# Patient Record
Sex: Male | Born: 1995 | Race: White | Hispanic: No | Marital: Single | Smoking: Former smoker
Health system: Southern US, Community
[De-identification: ages and names within clinical notes are randomized; demographics above are authoritative.]

---
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
## 2014-07-16 ENCOUNTER — Telehealth: Payer: Self-pay | Admitting: Emergency Medicine

## 2014-07-16 ENCOUNTER — Emergency Department (INDEPENDENT_AMBULATORY_CARE_PROVIDER_SITE_OTHER): Payer: 59

## 2014-07-16 ENCOUNTER — Emergency Department
Admission: EM | Admit: 2014-07-16 | Discharge: 2014-07-16 | Disposition: A | Discharge status: Home / Self Care | Payer: 59 | Source: Home / Self Care | Attending: Family Medicine | Admitting: Family Medicine

## 2014-07-16 ENCOUNTER — Encounter: Payer: Self-pay | Admitting: Emergency Medicine

## 2014-07-16 DIAGNOSIS — J029 Acute pharyngitis, unspecified: Secondary | ICD-10-CM

## 2014-07-16 DIAGNOSIS — R05 Cough: Secondary | ICD-10-CM

## 2014-07-16 DIAGNOSIS — R059 Cough, unspecified: Secondary | ICD-10-CM

## 2014-07-16 DIAGNOSIS — J069 Acute upper respiratory infection, unspecified: Secondary | ICD-10-CM

## 2014-07-16 LAB — POCT CBC W AUTO DIFF (K'VILLE URGENT CARE)

## 2014-07-16 LAB — POCT RAPID STREP A (OFFICE): RAPID STREP A SCREEN: NEGATIVE

## 2014-07-16 MED ORDER — BENZONATATE 200 MG PO CAPS: 200.0000 mg | ORAL_CAPSULE | Freq: Every day | ORAL | Status: DC

## 2014-07-16 MED ORDER — AZITHROMYCIN 250 MG PO TABS: ORAL_TABLET | ORAL | Status: DC

## 2014-07-16 NOTE — ED Provider Notes (Signed)
CSN: [id number]     Arrival date & time 07/16/14  1111 History   First MD Initiated Contact with Patient 07/16/14 1138     Chief Complaint  Patient presents with  . Sore Throat      HPI Comments: Patient complains of persistent productive cough for one month, now worse for about a week.  He often coughs until he gags.  He has had a sore throat and increased sinus congestion for several days.  He developed chills last night.  The history is provided by the patient.    History reviewed. No pertinent past medical history. History reviewed. No pertinent past surgical history. History reviewed. No pertinent family history. History  Substance Use Topics  . Smoking status: Current Every Day Smoker  . Smokeless tobacco: Not on file  . Alcohol Use: No    Review of Systems + sore throat + cough No pleuritic pain No wheezing + nasal congestion + post-nasal drainage ? sinus pain/pressure No itchy/red eyes ? earache No hemoptysis No SOB No fever, + chills No nausea No vomiting No abdominal pain No diarrhea No urinary symptoms No skin rash + fatigue No myalgias + headache Used OTC meds without relief   Allergies  Review of patient's allergies indicates no known allergies.  Home Medications   Prior to Admission medications   Medication Sig Start Date End Date Taking? Authorizing Provider  azithromycin (ZITHROMAX Z-PAK) 250 MG tablet Take 2 tabs today; then begin one tab once daily for 4 more days. 07/16/14   Lattie Haw, MD  benzonatate (TESSALON) 200 MG capsule Take 1 capsule (200 mg total) by mouth at bedtime. Take as needed for cough 07/16/14   Lattie Haw, MD   BP 107/66  Pulse 85  Temp(Src) 98.4 F (36.9 C) (Oral)  Ht 6\' 2"  (1.88 m)  Wt 150 lb (68.04 kg)  BMI 19.25 kg/m2  SpO2 99% Physical Exam Nursing notes and Vital Signs reviewed. Appearance:  Patient appears healthy, stated age, and in no acute distress Eyes:  Pupils are equal, round, and reactive to  light and accomodation.  Extraocular movement is intact.  Conjunctivae are not inflamed  Ears:  Canals normal.  Tympanic membranes normal.  Nose:  Mildly congested turbinates, worse on the right.  No sinus tenderness.   Pharynx:  Erythematous without exudate or swelling Neck:  Supple.   Tender shotty anterior/posterior nodes are palpated bilaterally  Lungs:  Clear to auscultation.  Breath sounds are equal.  Heart:  Regular rate and rhythm without murmurs, rubs, or gallops.  Abdomen:  Nontender without masses or hepatosplenomegaly.  Bowel sounds are present.  No CVA or flank tenderness.  Extremities:  No edema.  No calf tenderness Skin:  No rash present.   ED Course  Procedures  none    Labs Reviewed  POCT RAPID STREP A (OFFICE) negative POCT CBC:  WBC 8.9; LY 16.5; MO 3.4; GR 80.1; Hgb 13.2; Platelets 182    Imaging Review: DG Chest 2 View (Final result)  Result time: 07/16/14 12:35:18    Final result by Rad Results In Interface (07/16/14 12:35:18)    Narrative:   CLINICAL DATA: Cough  EXAM: CHEST 2 VIEW  COMPARISON: None.  FINDINGS: The heart size and mediastinal contours are within normal limits. Both lungs are clear. The visualized skeletal structures are unremarkable.  IMPRESSION: No active cardiopulmonary disease.   Electronically Signed By: Christiana Pellant M.D. On: 07/16/2014 12:35        MDM  1. Acute upper respiratory infections of unspecified site   2. Acute pharyngitis, unspecified pharyngitis type    Begin Z-pack for atypical coverage.  Prescription written for Benzonatate [facility](Tessalon) to take at bedtime for night-time cough.  Take plain Mucinex (1200 mg guaifenesin) twice daily for cough and congestion.  May add Sudafed for sinus congestion.   Increase fluid intake, rest. May use Afrin nasal spray (or generic oxymetazoline) twice daily for about 5 days.  Also recommend using saline nasal spray several times daily and saline nasal irrigation (AYR is a  common brand) Try warm salt water gargles for sore throat.  Stop all antihistamines for now, and other non-prescription cough/cold preparations. May take Ibuprofen 200mg , 4 tabs every 8 hours with food for headache, sore throat, fever, etc. Followup with Family Doctor if not improved in about 10 days.    Lattie HawStephen A Caster F[place], MD 07/19/14 [phone]

## 2014-07-16 NOTE — Discharge Instructions (Signed)
Take plain Mucinex (1200 mg guaifenesin) twice daily for cough and congestion.  May add Sudafed for sinus congestion.   Increase fluid intake, rest. May use Afrin nasal spray (or generic oxymetazoline) twice daily for about 5 days.  Also recommend using saline nasal spray several times daily and saline nasal irrigation (AYR is a common brand) Try warm salt water gargles for sore throat.  Stop all antihistamines for now, and other non-prescription cough/cold preparations. May take Ibuprofen 200mg , 4 tabs every 8 hours with food for headache, sore throat, fever, etc.   Follow-up with family doctor if not improving 7 to 10 days.    Salt Water Gargle This solution will help make your mouth and throat feel better. HOME CARE INSTRUCTIONS   Mix 1 teaspoon of salt in 8 ounces of warm water.  Gargle with this solution as much or often as you need or as directed. Swish and gargle gently if you have any sores or wounds in your mouth.  Do not swallow this mixture. Document Released: 09/04/2004 Document Revised: 02/23/2012 Document Reviewed: 01/26/2009 [facility] Patient Information 2015 [employer], [place]. This information is not intended to replace advice given to you by your health care provider. Make sure you discuss any questions you have with your health care provider.

## 2014-07-16 NOTE — ED Notes (Signed)
Reports cough and congestion for about a month; yesterday developed into severe sore throat. No known fever but did have chills last night. No OTCs..Marland Kitchen

## 2014-07-17 ENCOUNTER — Telehealth: Payer: Self-pay | Admitting: *Deleted

## 2014-07-17 LAB — STREP A DNA PROBE: GASP: POSITIVE

## 2015-07-24 IMAGING — CR DG CHEST 2V
2 series · 2 of 2 positions shown · non-contrast
Comparison: None.

CLINICAL DATA: Cough

EXAM:
CHEST  2 VIEW

[view not recorded (1 of 2)]
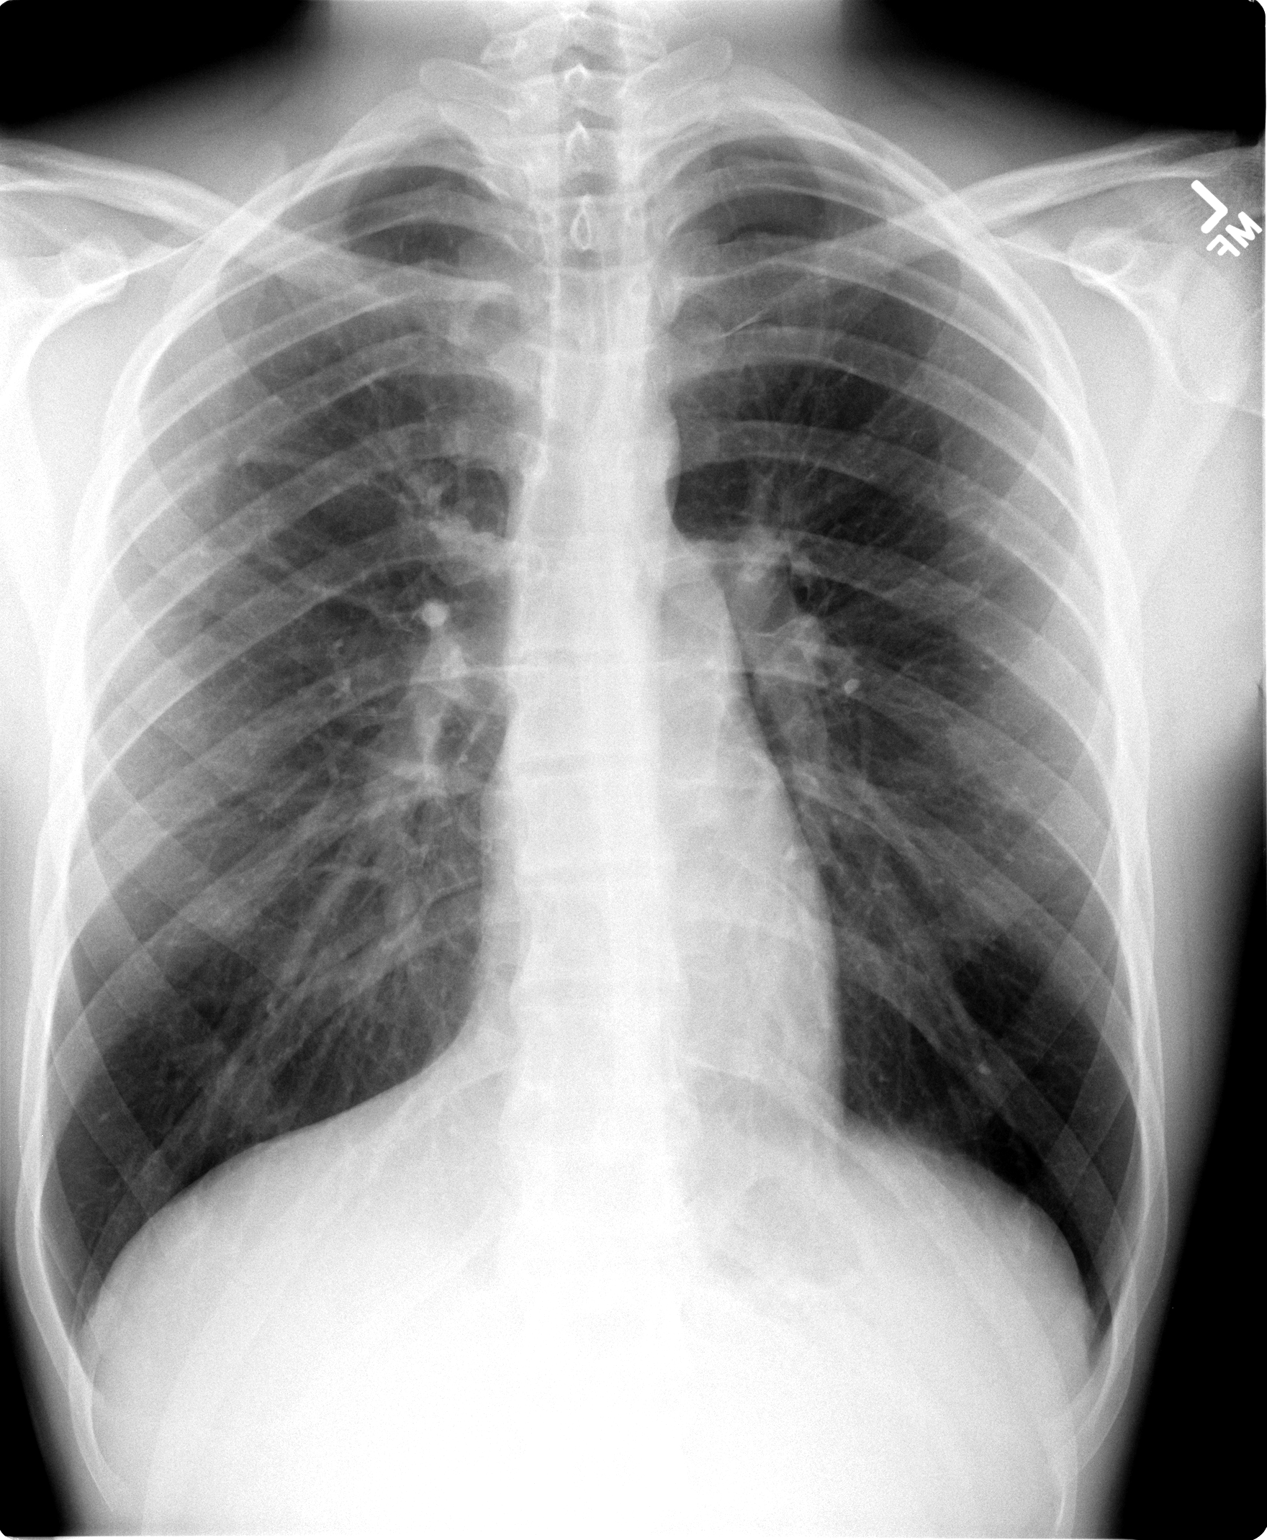

[view not recorded (2 of 2)]
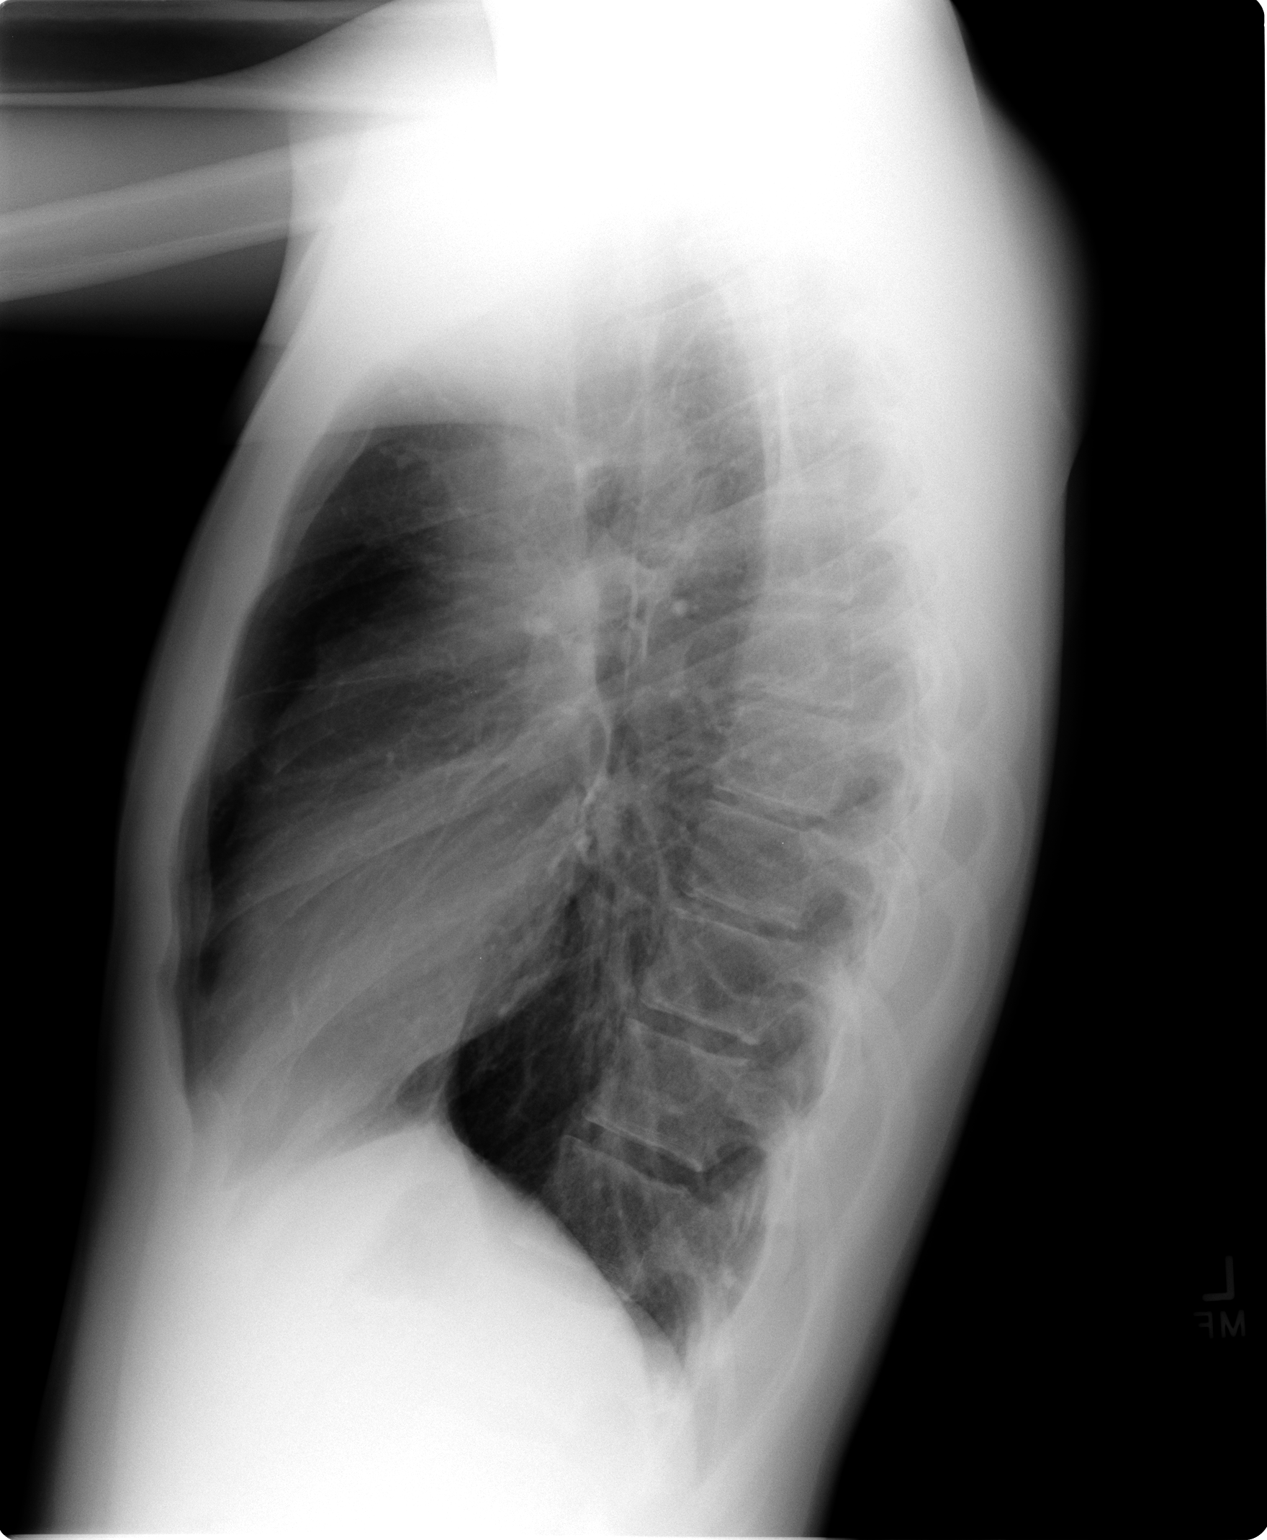

[2 of 2 positions shown; findings below may reference images not displayed]

FINDINGS: The heart size and mediastinal contours are within normal limits.
Both lungs are clear. The visualized skeletal structures are
unremarkable.
IMPRESSION: No active cardiopulmonary disease.

## 2016-01-28 ENCOUNTER — Emergency Department (INDEPENDENT_AMBULATORY_CARE_PROVIDER_SITE_OTHER)
Admission: EM | Admit: 2016-01-28 | Discharge: 2016-01-28 | Disposition: A | Discharge status: Home / Self Care | Payer: 59 | Source: Home / Self Care | Attending: Family Medicine | Admitting: Family Medicine

## 2016-01-28 ENCOUNTER — Encounter: Payer: Self-pay | Admitting: *Deleted

## 2016-01-28 DIAGNOSIS — S61011A Laceration without foreign body of right thumb without damage to nail, initial encounter: Secondary | ICD-10-CM

## 2016-01-28 NOTE — Discharge Instructions (Signed)
Change dressing daily and apply Bacitracin ointment to wound.  Keep wound clean and dry.  Return for any signs of infection (or follow-up with family doctor):  Increasing redness, swelling, pain, heat, drainage, etc. °Return in 10 days for suture removal.   ° ° °Laceration Care, Adult °A laceration is a cut that goes through all of the layers of the skin and into the tissue that is right under the skin. Some lacerations heal on their own. Others need to be closed with stitches (sutures), staples, skin adhesive strips, or skin glue. Proper laceration care minimizes the risk of infection and helps the laceration to heal better. °HOW TO CARE FOR YOUR LACERATION °If sutures or staples were used: °· Keep the wound clean and dry. °· If you were given a bandage (dressing), you should change it at least one time per day or as told by your health care provider. You should also change it if it becomes wet or dirty. °· Keep the wound completely dry for the first 24 hours or as told by your health care provider. After that time, you may shower or bathe. However, make sure that the wound is not soaked in water until after the sutures or staples have been removed. °· Clean the wound one time each day or as told by your health care provider: °¨ Wash the wound with soap and water. °¨ Rinse the wound with water to remove all soap. °¨ Pat the wound dry with a clean towel. Do not rub the wound. °· After cleaning the wound, apply a thin layer of antibiotic ointment as told by your health care provider. This will help to prevent infection and keep the dressing from sticking to the wound. °· Have the sutures or staples removed as told by your health care provider. °If skin adhesive strips were used: °· Keep the wound clean and dry. °· If you were given a bandage (dressing), you should change it at least one time per day or as told by your health care provider. You should also change it if it becomes dirty or wet. °· Do not get the skin  adhesive strips wet. You may shower or bathe, but be careful to keep the wound dry. °· If the wound gets wet, pat it dry with a clean towel. Do not rub the wound. °· Skin adhesive strips fall off on their own. You may trim the strips as the wound heals. Do not remove skin adhesive strips that are still stuck to the wound. They will fall off in time. °If skin glue was used: °· Try to keep the wound dry, but you may briefly wet it in the shower or bath. Do not soak the wound in water, such as by swimming. °· After you have showered or bathed, gently pat the wound dry with a clean towel. Do not rub the wound. °· Do not do any activities that will make you sweat heavily until the skin glue has fallen off on its own. °· Do not apply liquid, cream, or ointment medicine to the wound while the skin glue is in place. Using those may loosen the film before the wound has healed. °· If you were given a bandage (dressing), you should change it at least one time per day or as told by your health care provider. You should also change it if it becomes dirty or wet. °· If a dressing is placed over the wound, be careful not to apply tape directly over the skin glue.   Doing that may cause the glue to be pulled off before the wound has healed. °· Do not pick at the glue. The skin glue usually remains in place for 5-10 days, then it falls off of the skin. °General Instructions °· Take over-the-counter and prescription medicines only as told by your health care provider. °· If you were prescribed an antibiotic medicine or ointment, take or apply it as told by your doctor. Do not stop using it even if your condition improves. °· To help prevent scarring, make sure to cover your wound with sunscreen whenever you are outside after stitches are removed, after adhesive strips are removed, or when glue remains in place and the wound is healed. Make sure to wear a sunscreen of at least 30 SPF. °· Do not scratch or pick at the wound. °· Keep all  follow-up visits as told by your health care provider. This is important. °· Check your wound every day for signs of infection. Watch for: °¨ Redness, swelling, or pain. °¨ Fluid, blood, or pus. °· Raise (elevate) the injured area above the level of your heart while you are sitting or lying down, if possible. °SEEK MEDICAL CARE IF: °· You received a tetanus shot and you have swelling, severe pain, redness, or bleeding at the injection site. °· You have a fever. °· A wound that was closed breaks open. °· You notice a bad smell coming from your wound or your dressing. °· You notice something coming out of the wound, such as wood or glass. °· Your pain is not controlled with medicine. °· You have increased redness, swelling, or pain at the site of your wound. °· You have fluid, blood, or pus coming from your wound. °· You notice a change in the color of your skin near your wound. °· You need to change the dressing frequently due to fluid, blood, or pus draining from the wound. °· You develop a new rash. °· You develop numbness around the wound. °SEEK IMMEDIATE MEDICAL CARE IF: °· You develop severe swelling around the wound. °· Your pain suddenly increases and is severe. °· You develop painful lumps near the wound or on skin that is anywhere on your body. °· You have a red streak going away from your wound. °· The wound is on your hand or foot and you cannot properly move a finger or toe. °· The wound is on your hand or foot and you notice that your fingers or toes look pale or bluish. °  °This information is not intended to replace advice given to you by your health care provider. Make sure you discuss any questions you have with your health care provider. °  °Document Released: 12/01/2005 Document Revised: 04/17/2015 Document Reviewed: 11/27/2014 °Elsevier Interactive Patient Education ©2016 Elsevier Inc. ° °

## 2016-01-28 NOTE — ED Provider Notes (Signed)
CSN: [id number]     Arrival date & time 01/28/16  1841 History   First MD Initiated Contact with Patient 01/28/16 1926     Chief Complaint  Patient presents with  . Extremity Laceration     HPI Comments: Patient accidentally cut his right thumb with knife about 45 minutes prior.  He received his last Tdap in 6th grade, and refuses a Tdap today.  Patient is a 20 y.o. male presenting with skin laceration. The history is provided by the patient.  Laceration Location:  Finger Finger laceration location:  R thumb Length (cm):  2 Depth:  Through dermis Quality: straight   Bleeding: controlled   Time since incident:  45 minutes Laceration mechanism:  Knife Pain details:    Quality:  Aching   Severity:  Mild   Timing:  Constant   Progression:  Improving Foreign body present:  No foreign bodies Relieved by:  Pressure Worsened by:  Movement Tetanus status:  Out of date   History reviewed. No pertinent past medical history. History reviewed. No pertinent past surgical history. History reviewed. No pertinent family history. Social History  Substance Use Topics  . Smoking status: Current Every Day Smoker  . Smokeless tobacco: None  . Alcohol Use: No    Review of Systems  All other systems reviewed and are negative.   Allergies  Review of patient's allergies indicates no known allergies.  Home Medications   Prior to Admission medications   Not on File   Meds Ordered and Administered this Visit  Medications - No data to display  BP 131/83 mmHg  Pulse 82  Temp(Src) 98.7 F (37.1 C) (Oral)  Resp 16  Ht  (1.88 m)  Wt 160 lb (72.576 kg)  BMI 20.53 kg/m2  SpO2 99% No data found.   Physical Exam  Constitutional: He is oriented to person, place, and time. He appears well-developed and well-nourished. No distress.  HENT:  Head: Atraumatic.  Eyes: Pupils are equal, round, and reactive to light.  Musculoskeletal:       Right hand: He exhibits laceration. He  exhibits normal range of motion, no tenderness, no bony tenderness, normal two-point discrimination and normal capillary refill. Normal sensation noted. Normal strength noted.       Hands: Right thumb has a 1cm long simple superficial laceration distal phalanx as noted on diagram.    Neurological: He is alert and oriented to person, place, and time.  Skin: Skin is warm and dry.  Nursing note and vitals reviewed.   ED Course  Procedures  Laceration Repair Discussed benefits and risks of procedure and verbal consent obtained. Using sterile technique and digital anesthesia with 2% lidocaine without epinephrine, cleansed wound with Betadine followed by copious lavage with normal saline.  Wound carefully inspected for debris and foreign bodies; none found.  Wound closed with #3, 4-0 interrupted nylon sutures.  Bacitracin and non-stick sterile dressing applied.  Wound precautions explained to patient.  Return for suture removal in 10 days.   MDM   1. Laceration of right thumb, initial encounter     Change dressing daily and apply Bacitracin ointment to wound.  Keep wound clean and dry.  Return for any signs of infection (or follow-up with family doctor):  Increasing redness, swelling, pain, heat, drainage, etc. Return in 10 days for suture removal.      Lattie Haw, MD 01/28/16 2022

## 2016-01-28 NOTE — ED Notes (Signed)
Pt c/o RT thumb laceration after closing his knife on it 45 minutes ago. He reports last Tdap was in 6th grade. Pt refused a Tdap vaccine today.

## 2016-02-07 ENCOUNTER — Encounter: Payer: Self-pay | Admitting: *Deleted

## 2016-02-07 ENCOUNTER — Emergency Department
Admission: EM | Admit: 2016-02-07 | Discharge: 2016-02-07 | Disposition: A | Discharge status: Home / Self Care | Payer: 59 | Source: Home / Self Care | Attending: Family Medicine | Admitting: Family Medicine

## 2016-02-07 DIAGNOSIS — Z4802 Encounter for removal of sutures: Secondary | ICD-10-CM

## 2016-02-07 NOTE — Discharge Instructions (Signed)
Incision Care °An incision is when a surgeon cuts into your body. After surgery, the incision needs to be cared for properly to prevent infection.  °HOW TO CARE FOR YOUR INCISION °· Take medicines only as directed by your health care provider. °· There are many different ways to close and cover an incision, including stitches, skin glue, and adhesive strips. Follow your health care provider's instructions on: °¨ Incision care. °¨ Bandage (dressing) changes and removal. °¨ Incision closure removal. °· Do not take baths, swim, or use a hot tub until your health care provider approves. You may shower as directed by your health care provider. °· Resume your normal diet and activities as directed. °· Use anti-itch medicine (such as an antihistamine) as directed by your health care provider. The incision may itch while it is healing. Do not pick or scratch at the incision. °· Drink enough fluid to keep your urine clear or pale yellow. °SEEK MEDICAL CARE IF:  °· You have drainage, redness, swelling, or pain at your incision site. °· You have muscle aches, chills, or a general ill feeling. °· You notice a bad smell coming from the incision or dressing. °· Your incision edges separate after the sutures, staples, or skin adhesive strips have been removed. °· You have persistent nausea or vomiting. °· You have a fever. °· You are dizzy. °SEEK IMMEDIATE MEDICAL CARE IF:  °· You have a rash. °· You faint. °· You have difficulty breathing. °MAKE SURE YOU:  °· Understand these instructions. °· Will watch your condition. °· Will get help right away if you are not doing well or get worse. °  °This information is not intended to replace advice given to you by your health care provider. Make sure you discuss any questions you have with your health care provider. °  °Document Released: 06/20/2005 Document Revised: 12/22/2014 Document Reviewed: 01/25/2014 °Elsevier Interactive Patient Education ©2016 Elsevier Inc. ° °

## 2016-02-07 NOTE — ED Provider Notes (Signed)
CSN: [id number]     Arrival date & time 02/07/16  1436 History   First MD Initiated Contact with Patient 02/07/16 1505     Chief Complaint  Patient presents with  . Suture / Staple Removal      HPI Comments: Returns for suture removal from laceration right thumb.  No complaints.  The history is provided by the patient.    History reviewed. No pertinent past medical history. History reviewed. No pertinent past surgical history. History reviewed. No pertinent family history. Social History  Substance Use Topics  . Smoking status: Current Every Day Smoker  . Smokeless tobacco: None  . Alcohol Use: No    Review of Systems Denies pain or drainage Allergies  Review of patient's allergies indicates no known allergies.  Home Medications   Prior to Admission medications   Not on File   Meds Ordered and Administered this Visit  Medications - No data to display  There were no vitals taken for this visit. No data found.   Physical Exam Right thumb:  Well healed laceration without swelling, erythema, drainage, or tenderness. ED Course  Procedures Two sutures removed from laceration right thumb  MDM   1. Visit for suture removal    Laceration well healed without evidence cellulitis    Lattie Haw, MD 02/07/16 [phone]

## 2016-02-07 NOTE — ED Notes (Signed)
Pt is here today for suture removal from his RT thumb placed on 01/28/16.

## 2016-04-07 ENCOUNTER — Emergency Department
Admission: EM | Admit: 2016-04-07 | Discharge: 2016-04-07 | Disposition: A | Discharge status: Home / Self Care | Payer: 59 | Source: Home / Self Care | Attending: Family Medicine | Admitting: Family Medicine

## 2016-04-07 ENCOUNTER — Encounter: Payer: Self-pay | Admitting: *Deleted

## 2016-04-07 DIAGNOSIS — R21 Rash and other nonspecific skin eruption: Secondary | ICD-10-CM

## 2016-04-07 MED ORDER — DOXYCYCLINE HYCLATE 100 MG PO CAPS: 100.0000 mg | ORAL_CAPSULE | Freq: Two times a day (BID) | ORAL | Status: DC

## 2016-04-07 MED ORDER — PREDNISONE 20 MG PO TABS: 20.0000 mg | ORAL_TABLET | Freq: Two times a day (BID) | ORAL | Status: DC

## 2016-04-07 NOTE — ED Provider Notes (Signed)
CSN: [id number]     Arrival date & time 04/07/16  1010 History   First MD Initiated Contact with Patient 04/07/16 1037     Chief Complaint  Patient presents with  . Insect Bite      HPI Comments: Three days ago while out of town, patient awoke with a few scattered pruritic macules on his arms and trunk.  No tick bites.  No hot tub use.  The lesions have persisted.  No fevers, chills, and sweats.  He feels well otherwise.    Patient is a 20 y.o. male presenting with rash. The history is provided by the patient.  Rash Location: trunk and arms. Quality: dryness, itchiness and redness   Quality: not blistering, not bruising, not burning, not draining, not painful, not peeling, not scaling, not swelling and not weeping   Severity:  Mild Onset quality:  Sudden Duration:  3 days Timing:  Constant Progression:  Unchanged Chronicity:  New Context: insect bite/sting   Context: not animal contact, not chemical exposure, not exposure to similar rash, not food, not hot tub use, not medications, not new detergent/soap, not plant contact, not pollen and not sick contacts   Relieved by:  Nothing Worsened by:  Nothing tried Ineffective treatments: Benadryl cream. Associated symptoms: no diarrhea, no fatigue, no fever, no headaches, no induration, no joint pain, no myalgias, no nausea, no sore throat and no URI     History reviewed. No pertinent past medical history. History reviewed. No pertinent past surgical history. History reviewed. No pertinent family history. Social History  Substance Use Topics  . Smoking status: Current Every Day Smoker  . Smokeless tobacco: None  . Alcohol Use: Yes     Comment: 2 per week    Review of Systems  Constitutional: Negative for fever and fatigue.  HENT: Negative for sore throat.   Gastrointestinal: Negative for nausea and diarrhea.  Musculoskeletal: Negative for myalgias and arthralgias.  Skin: Positive for rash.  Neurological: Negative for headaches.    All other systems reviewed and are negative.   Allergies  Review of patient's allergies indicates no known allergies.  Home Medications   Prior to Admission medications   Medication Sig Start Date End Date Taking? Authorizing Provider  doxycycline (VIBRAMYCIN) 100 MG capsule Take 1 capsule (100 mg total) by mouth 2 (two) times daily. Take with food. 04/07/16   Albert HawStephen A Chiara Coltrin, MD  predniSONE (DELTASONE) 20 MG tablet Take 1 tablet (20 mg total) by mouth 2 (two) times daily. Take with food. 04/07/16   Albert HawStephen A Kista Robb, MD   Meds Ordered and Administered this Visit  Medications - No data to display  BP 109/70 mmHg  Pulse 71  Temp(Src) 98.4 F (36.9 C) (Oral)  Resp 16  Ht 6\' 2"  (1.88 m)  Wt 166 lb (75.297 kg)  BMI 21.30 kg/m2  SpO2 98% No data found.   Physical Exam  Constitutional: He is oriented to person, place, and time. He appears well-developed and well-nourished. No distress.  HENT:  Head: Normocephalic.  Nose: Nose normal.  Mouth/Throat: Oropharynx is clear and moist.  Eyes: Conjunctivae are normal. Pupils are equal, round, and reactive to light.  Neck: Neck supple.  Cardiovascular: Normal heart sounds.   Pulmonary/Chest: Breath sounds normal.  Abdominal: There is no tenderness.  Musculoskeletal: He exhibits no edema or tenderness.  Lymphadenopathy:    He has no cervical adenopathy.  Neurological: He is alert and oriented to person, place, and time.  Skin: Skin is warm and  dry. Rash noted. Rash is macular. Rash is not pustular, not vesicular and not urticarial.     Arms have scattered erythematous macules 2 to 8mm diameter, some with central eschar.  There are a few lesions on trunk.  Nursing note and vitals reviewed.   ED Course  Procedures none  MDM   1. Rash and nonspecific skin eruption; ?infected insect bites    Begin doxycycline  BID for staph coverage.  Prednisone burst. May take Benadryl  (2 caps) at bedtime for itching. Followup with  dermatologist if not improving one week.    Albert Haw, MD 04/08/16 1426

## 2016-04-07 NOTE — ED Notes (Signed)
Pt c/o itching insect bites all over his body x 3 days. He has applied Benadryl cream.

## 2016-04-07 NOTE — Discharge Instructions (Signed)
May take Benadryl 25mg  (2 caps) at bedtime for itching.

## 2018-10-20 ENCOUNTER — Emergency Department (INDEPENDENT_AMBULATORY_CARE_PROVIDER_SITE_OTHER)
Admission: EM | Admit: 2018-10-20 | Discharge: 2018-10-20 | Disposition: A | Discharge status: Home / Self Care | Payer: 59 | Source: Home / Self Care | Attending: Family Medicine | Admitting: Family Medicine

## 2018-10-20 ENCOUNTER — Emergency Department (INDEPENDENT_AMBULATORY_CARE_PROVIDER_SITE_OTHER): Payer: 59

## 2018-10-20 ENCOUNTER — Encounter: Payer: Self-pay | Admitting: *Deleted

## 2018-10-20 ENCOUNTER — Other Ambulatory Visit: Payer: Self-pay

## 2018-10-20 DIAGNOSIS — R05 Cough: Secondary | ICD-10-CM

## 2018-10-20 DIAGNOSIS — R509 Fever, unspecified: Secondary | ICD-10-CM

## 2018-10-20 DIAGNOSIS — J069 Acute upper respiratory infection, unspecified: Secondary | ICD-10-CM

## 2018-10-20 MED ORDER — AZITHROMYCIN 250 MG PO TABS: 250.0000 mg | ORAL_TABLET | Freq: Every day | ORAL | 0 refills | Status: DC

## 2018-10-20 MED ORDER — BENZONATATE 100 MG PO CAPS: 100.0000 mg | ORAL_CAPSULE | Freq: Three times a day (TID) | ORAL | 0 refills | Status: DC

## 2018-10-20 NOTE — ED Triage Notes (Signed)
Pt c/o HA, cough and fever at onset 2 wks ago. The fever and HA have resolved, but he still c/o a lingering productive cough. Today he noted a small amt of blood tinged mucus one time.

## 2018-10-20 NOTE — ED Provider Notes (Signed)
[facility]    CSN: [id number] Arrival date & time: 10/20/18  1745     History   Chief Complaint Chief Complaint  Patient presents with  . Cough    HPI Xzavion Doswell is a 22 y.o. male.   HPI  Coltrane Tugwell is a 22 y.o. male presenting to UC with c/o generalized headache, mildly productive cough and low grade fever that started 2 weeks ago.  HA and fever have resolved but cough has lingered.  He did have a small amount of blood tinged mucous earlier today.  No sick contacts but his SO works at a daycare and thinks she may have brought something home to the pt.  Denies n/v/d.  Pt does use e-cigarettes.    History reviewed. No pertinent past medical history.  Patient Active Problem List   Diagnosis Date Noted  . CELLULITIS 11/01/2009  . CONTUSION MULTIPLE SITES SHOULDER&UPPER ARM 03/24/2009  . DYSPHAGIA, UNSPECIFIED 07/23/2007    History reviewed. No pertinent surgical history.     Home Medications    Prior to Admission medications   Medication Sig Start Date End Date Taking? Authorizing Provider  azithromycin (ZITHROMAX) 250 MG tablet Take 1 tablet (250 mg total) by mouth daily. Take first 2 tablets together, then 1 every day until finished. 10/20/18   Lurene Shadow, PA-C  benzonatate (TESSALON) 100 MG capsule Take 1-2 capsules (100-200 mg total) by mouth every 8 (eight) hours. 10/20/18   Lurene Shadow, PA-C  mupirocin (BACTROBAN) 2 % cream Apply topically 3 (three) times daily.    07/16/14  [provider]    Family History History reviewed. No pertinent family history.  Social History Social History   Tobacco Use  . Smoking status: Former Smoker    Types: Cigarettes  . Smokeless tobacco: Never Used  Substance Use Topics  . Alcohol use: Yes    Comment: 4-6 per week  . Drug use: No     Allergies   Patient has no known allergies.   Review of Systems Review of Systems  Constitutional: Negative for chills and fever.  HENT:  Positive for congestion. Negative for ear pain, sore throat, trouble swallowing and voice change.   Respiratory: Positive for cough. Negative for shortness of breath.   Cardiovascular: Negative for chest pain and palpitations.  Gastrointestinal: Negative for abdominal pain, diarrhea, nausea and vomiting.  Musculoskeletal: Negative for arthralgias, back pain and myalgias.  Skin: Negative for rash.     Physical Exam Triage Vital Signs ED Triage Vitals  Enc Vitals Group     BP 10/20/18 1805 134/84     Pulse Rate 10/20/18 1805 67     Resp 10/20/18 1805 18     Temp 10/20/18 1805 98.7 F (37.1 C)     Temp Source 10/20/18 1805 Oral     SpO2 10/20/18 1805 100 %     Weight 10/20/18 1806 183 lb (83 kg)     Height 10/20/18 1806 6\' 2"  (1.88 m)     Head Circumference --      Peak Flow --      Pain Score 10/20/18 1805 0     Pain Loc --      Pain Edu? --      Excl. in GC? --    No data found.  Updated Vital Signs BP 134/84 (BP Location: Right Arm)   Pulse 67   Temp 98.7 F (37.1 C) (Oral)   Resp 18   Ht 6'  2" (1.88 m)   Wt 183 lb (83 kg)   SpO2 100%   BMI 23.50 kg/m   Visual Acuity Right Eye Distance:   Left Eye Distance:   Bilateral Distance:    Right Eye Near:   Left Eye Near:    Bilateral Near:     Physical Exam  Constitutional: He is oriented to person, place, and time. He appears well-developed and well-nourished. No distress.  HENT:  Head: Normocephalic and atraumatic.  Right Ear: Tympanic membrane normal.  Left Ear: Tympanic membrane normal.  Nose: Nose normal. Right sinus exhibits no maxillary sinus tenderness and no frontal sinus tenderness. Left sinus exhibits no maxillary sinus tenderness and no frontal sinus tenderness.  Mouth/Throat: Uvula is midline, oropharynx is clear and moist and mucous membranes are normal.  Eyes: EOM are normal.  Neck: Normal range of motion. Neck supple.  Cardiovascular: Normal rate and regular rhythm.  Pulmonary/Chest: Effort  normal and breath sounds normal. No stridor. No respiratory distress. He has no wheezes. He has no rales.  Musculoskeletal: Normal range of motion.  Neurological: He is alert and oriented to person, place, and time.  Skin: Skin is warm and dry. He is not diaphoretic.  Psychiatric: He has a normal mood and affect. His behavior is normal.  Nursing note and vitals reviewed.    UC Treatments / Results  Labs (all labs ordered are listed, but only abnormal results are displayed) Labs Reviewed - No data to display  EKG None  Radiology Dg Chest 2 View  Result Date: 10/20/2018 CLINICAL DATA:  Productive cough for 2 weeks.  Low-grade fever. EXAM: CHEST - 2 VIEW COMPARISON:  Chest radiograph July 16, 2014 FINDINGS: Cardiomediastinal silhouette is normal. No pleural effusions or focal consolidations. Trachea projects midline and there is no pneumothorax. Soft tissue planes and included osseous structures are non-suspicious. IMPRESSION: Negative. Electronically Signed   By: Awilda Metro M.D.   On: 10/20/2018 18:32    Procedures Procedures (including critical care time)  Medications Ordered in UC Medications - No data to display  Initial Impression / Assessment and Plan / UC Course  I have reviewed the triage vital signs and the nursing notes.  Pertinent labs & imaging results that were available during my care of the patient were reviewed by me and considered in my medical decision making (see chart for details).     CXR reviewed and discussed with pt Encouraged symptomatic treatment  Prescription for azithromycin provided for pt to start if OTC cough medication and Tessalon not helping, and if symptoms worsening. Encouraged pt to stop vaping.   Final Clinical Impressions(s) / UC Diagnoses   Final diagnoses:  Upper respiratory tract infection, unspecified type     Discharge Instructions      Your chest x-ray did not show any bronchitis or pneumonia at this time. You may  try the prescribed cough medication and over the counter Mucinex or Robitussin to help with cough.   Vaping and smoking can also worsen your cough.   There have been recent reports of sudden severe lung damage and even death due to vaping. Please read more information in this packet about e-cigarettes.   If your cough is not improving in 2-3 days, you develop a fever or worsening cough, you may start the antibiotic to cover for a secondary bacterial infection. Please follow up with family medicine in 1 week if not improving.    ED Prescriptions    Medication Sig Dispense Auth. Provider  benzonatate (TESSALON) 100 MG capsule Take 1-2 capsules (100-200 mg total) by mouth every 8 (eight) hours. 21 capsule Doroteo Glassman, Zariana Strub O, PA-C   azithromycin (ZITHROMAX) 250 MG tablet Take 1 tablet (250 mg total) by mouth daily. Take first 2 tablets together, then 1 every day until finished. 6 tablet Lurene Shadow, PA-C     Controlled Substance Prescriptions [place] Controlled Substance Registry consulted? Not Applicable   Rolla Plate 10/20/18 1849

## 2018-10-20 NOTE — Discharge Instructions (Signed)
°  Your chest x-ray did not show any bronchitis or pneumonia at this time. You may try the prescribed cough medication and over the counter Mucinex or Robitussin to help with cough.   Vaping and smoking can also worsen your cough.   There have been recent reports of sudden severe lung damage and even death due to vaping. Please read more information in this packet about e-cigarettes.   If your cough is not improving in 2-3 days, you develop a fever or worsening cough, you may start the antibiotic to cover for a secondary bacterial infection. Please follow up with family medicine in 1 week if not improving.

## 2018-12-22 ENCOUNTER — Other Ambulatory Visit: Payer: Self-pay

## 2018-12-22 ENCOUNTER — Emergency Department
Admission: EM | Admit: 2018-12-22 | Discharge: 2018-12-22 | Disposition: A | Discharge status: Home / Self Care | Payer: 59 | Source: Home / Self Care | Attending: Emergency Medicine | Admitting: Emergency Medicine

## 2018-12-22 ENCOUNTER — Encounter: Payer: Self-pay | Admitting: *Deleted

## 2018-12-22 DIAGNOSIS — J40 Bronchitis, not specified as acute or chronic: Secondary | ICD-10-CM | POA: Diagnosis not present

## 2018-12-22 DIAGNOSIS — J101 Influenza due to other identified influenza virus with other respiratory manifestations: Secondary | ICD-10-CM | POA: Diagnosis not present

## 2018-12-22 DIAGNOSIS — J0100 Acute maxillary sinusitis, unspecified: Secondary | ICD-10-CM | POA: Diagnosis not present

## 2018-12-22 LAB — POCT INFLUENZA A/B
Influenza A, POC: NEGATIVE
Influenza B, POC: POSITIVE — AB

## 2018-12-22 LAB — POCT RAPID STREP A (OFFICE): Rapid Strep A Screen: NEGATIVE

## 2018-12-22 MED ORDER — IBUPROFEN 400 MG PO TABS
400.0000 mg | ORAL_TABLET | Freq: Once | ORAL | Status: AC
  Administered 2018-12-22: 400 mg via ORAL

## 2018-12-22 MED ORDER — AMOXICILLIN 875 MG PO TABS: ORAL_TABLET | ORAL | 0 refills | Status: DC

## 2018-12-22 MED ORDER — OSELTAMIVIR PHOSPHATE 75 MG PO CAPS: ORAL_CAPSULE | ORAL | 0 refills | Status: DC

## 2018-12-22 NOTE — ED Triage Notes (Signed)
Pt c/o cough and fever x 5 days. No OTC meds today.

## 2018-12-22 NOTE — ED Provider Notes (Signed)
[facility]    CSN: [id number] Arrival date & time: 12/22/18  4196     History   Chief Complaint Chief Complaint  Patient presents with  . Cough    HPI Turki Blok is a 23 y.o. male.   HPI Started about 4 days ago with fever congestion, cough.  Mild myalgias but not severe.  No rash.  Treated with OTC Tylenol and that helped somewhat.  It seemed like he was getting better 2 days ago, but then yesterday started spiking fevers, maximum documented temp 101 earlier this morning.  Mucinex cold and flu helped symptoms somewhat.  Symptoms progressed to moderately severe sore throat, sinus congestion, slight discolored rhinorrhea.  Progressed to chest congestion with hacking cough, occasionally productive of discolored sputum.  No chest pain or shortness of breath or wheezing that he has noticed. He quit smoking in the past.  He used to vape, but quit vaping November 2019.   History reviewed. No pertinent past medical history.  Patient Active Problem List   Diagnosis Date Noted  . CELLULITIS 11/01/2009  . CONTUSION MULTIPLE SITES SHOULDER&UPPER ARM 03/24/2009  . DYSPHAGIA, UNSPECIFIED 07/23/2007    History reviewed. No pertinent surgical history.     Home Medications    Prior to Admission medications   Medication Sig Start Date End Date Taking? Authorizing Provider  amoxicillin (AMOXIL) 875 MG tablet Take 1 twice a day X 10 days. 12/22/18   Lajean Manes, MD  oseltamivir (TAMIFLU) 75 MG capsule Starting today, take 1 capsule by mouth twice a day for 5 days. 12/22/18   Lajean Manes, MD    Family History History reviewed. No pertinent family history.  Social History Social History   Tobacco Use  . Smoking status: Former Smoker    Types: Cigarettes  . Smokeless tobacco: Never Used  Substance Use Topics  . Alcohol use: Yes    Comment: 4-6 per week  . Drug use: No     Allergies   Patient has no known allergies.   Review of Systems Review of Systems   All other systems reviewed and are negative.  Pertinent items noted in HPI and remainder of comprehensive ROS otherwise negative.   Physical Exam Triage Vital Signs ED Triage Vitals  Enc Vitals Group     BP 12/22/18 0934 124/73     Pulse Rate 12/22/18 0934 87     Resp 12/22/18 0934 18     Temp 12/22/18 0934 99.8 F (37.7 C)     Temp Source 12/22/18 0934 Oral     SpO2 12/22/18 0934 96 %     Weight 12/22/18 0935 184 lb (83.5 kg)     Height 12/22/18 0935 6\' 2"  (1.88 m)     Head Circumference --      Peak Flow --      Pain Score 12/22/18 0935 0     Pain Loc --      Pain Edu? --      Excl. in GC? --    No data found.  Updated Vital Signs BP 124/73 (BP Location: Right Arm)   Pulse 87   Temp 99.8 F (37.7 C) (Oral)   Resp 18   Ht 6\' 2"  (1.88 m)   Wt 83.5 kg   SpO2 96%   BMI 23.62 kg/m    Physical Exam Vitals signs and nursing note reviewed.  Constitutional:      General: He is not in acute distress.    Appearance: He is  well-developed.     Comments: He is ill-appearing but not toxic appearing.  Appears fatigued.  Alert, cooperative.  No acute distress.  Occasional cough noted.  HENT:     Head: Normocephalic and atraumatic.     Right Ear: Tympanic membrane, ear canal and external ear normal.     Left Ear: Tympanic membrane, ear canal and external ear normal.     Nose: Mucosal edema and rhinorrhea present.     Right Sinus: Maxillary sinus tenderness present.     Left Sinus: Maxillary sinus tenderness present.     Mouth/Throat:     Mouth: Mucous membranes are moist. No oral lesions.     Pharynx: No oropharyngeal exudate.     Comments: Tonsils 1+ red enlarged bilaterally without exudate.  Airway intact. Eyes:     General: No scleral icterus.       Right eye: No discharge.        Left eye: No discharge.  Neck:     Musculoskeletal: Neck supple.  Cardiovascular:     Rate and Rhythm: Normal rate and regular rhythm.     Heart sounds: Normal heart sounds.    Pulmonary:     Effort: Pulmonary effort is normal. No respiratory distress.     Breath sounds: Rhonchi present. No wheezing or rales.  Lymphadenopathy:     Cervical: No cervical adenopathy.  Skin:    General: Skin is warm and dry.     Findings: No rash.  Neurological:     Mental Status: He is alert and oriented to person, place, and time.    9:51 AM-discussed with patient.  Has signs of tonsillitis, sinusitis, and bronchitis, but it is also possible he has influenza.   I advised rapid flu test and strep test.  He agrees.  UC Treatments / Results  Labs (all labs ordered are listed, but only abnormal results are displayed) Labs Reviewed  POCT INFLUENZA A/B - Abnormal; Notable for the following components:      Result Value   Influenza B, POC Positive (*)    All other components within normal limits  POCT RAPID STREP A (OFFICE)   Reviewed with patient, influenza B test positive.  Influenza a negative.  Rapid strep test negative. EKG None  Radiology No results found.  Procedures Procedures (including critical care time)  Medications Ordered in UC Medications  ibuprofen (ADVIL,MOTRIN) tablet 400 mg (400 mg Oral Given 12/22/18 0937)    Initial Impression / Assessment and Plan / UC Course  I have reviewed the triage vital signs and the nursing notes.  Pertinent labs & imaging results that were available during my care of the patient were reviewed by me and considered in my medical decision making (see chart for details).     Final Clinical Impressions(s) / UC Diagnoses   Final diagnoses:  Influenza A with respiratory manifestations  Bronchitis  Acute non-recurrent maxillary sinusitis  Tonsillitis  Discharge Instructions     Your test is positive for influenza B.  Negative for influenza A.  Strep test negative In my opinion, you have both influenza B and likely have secondary bacterial infection causing sinus infection and bronchitis and tonsillitis. Treatment for  influenza is Tamiflu. Treatment for sinusitis/bronchitis/tonsillitis: Amoxicillin I sent these 2 prescriptions to your pharmacy. Please read attached instruction sheets on influenza, sinusitis and bronchitis. Excused from work for the next 4 days, may return to work Monday.-Separate work note printed. Tylenol or ibuprofen if needed for pain or fever.  Okay to  use over-the-counter Robitussin or Mucinex for expectorant Follow-up with your doctor if not better in 5 to 7 days, sooner if worse or new symptoms. ED Prescriptions    Medication Sig Dispense Auth. Provider   oseltamivir (TAMIFLU) 75 MG capsule Starting today, take 1 capsule by mouth twice a day for 5 days. 10 capsule Lajean Manes, MD   amoxicillin (AMOXIL) 875 MG tablet Take 1 twice a day X 10 days. 20 tablet Lajean Manes, MD        Lajean Manes, MD 12/22/18 [phone]

## 2018-12-22 NOTE — Discharge Instructions (Addendum)
Your test is positive for influenza B.  Negative for influenza A.  Strep test negative In my opinion, you have both influenza B and likely have secondary bacterial infection causing sinus infection and bronchitis and tonsillitis. Treatment for influenza is Tamiflu. Treatment for sinusitis/bronchitis/tonsillitis: Amoxicillin I sent these 2 prescriptions to your pharmacy. Please read attached instruction sheets on influenza, sinusitis and bronchitis. Excused from work for the next 4 days, may return to work Monday.-Separate work note printed. Tylenol or ibuprofen if needed for pain or fever.  Okay to use over-the-counter Robitussin or Mucinex for expectorant Follow-up with your doctor if not better in 5 to 7 days, sooner if worse or new symptoms.

## 2020-01-31 ENCOUNTER — Emergency Department (INDEPENDENT_AMBULATORY_CARE_PROVIDER_SITE_OTHER)
Admission: EM | Admit: 2020-01-31 | Discharge: 2020-01-31 | Disposition: A | Discharge status: Home / Self Care | Payer: PRIVATE HEALTH INSURANCE | Source: Home / Self Care

## 2020-01-31 ENCOUNTER — Encounter: Payer: Self-pay | Admitting: Emergency Medicine

## 2020-01-31 ENCOUNTER — Other Ambulatory Visit: Payer: Self-pay

## 2020-01-31 DIAGNOSIS — R52 Pain, unspecified: Secondary | ICD-10-CM | POA: Diagnosis not present

## 2020-01-31 DIAGNOSIS — J069 Acute upper respiratory infection, unspecified: Secondary | ICD-10-CM | POA: Diagnosis not present

## 2020-01-31 NOTE — ED Provider Notes (Signed)
[facility]    CSN: [id number] Arrival date & time: 01/31/20  1007      History   Chief Complaint Chief Complaint  Patient presents with  . Headache    HPI Albert Cameron is a 24 y.o. male.   24 year old male, with no significant past medical history, presenting today for Covid testing.  Patient states that he woke up this morning with headache, itchy throat, congestion with body aches.  States that he was sent by work to have a Covid test.  Denies any recent sick contacts or known Covid exposure.  Denies any loss of taste or smell.  No chest pain or shortness of breath.  The history is provided by the patient.  URI Presenting symptoms: congestion, cough, fatigue, rhinorrhea and sore throat   Presenting symptoms: no ear pain, no facial pain and no fever   Severity:  Mild Onset quality:  Gradual Duration:  1 day Timing:  Constant Progression:  Unchanged Chronicity:  New Relieved by:  Nothing Worsened by:  Nothing Associated symptoms: headaches and myalgias   Associated symptoms: no arthralgias, no neck pain, no sinus pain, no sneezing, no swollen glands and no wheezing   Risk factors: not elderly, no chronic cardiac disease, no chronic kidney disease, no chronic respiratory disease, no diabetes mellitus, no immunosuppression, no recent illness, no recent travel and no sick contacts     History reviewed. No pertinent past medical history.  Patient Active Problem List   Diagnosis Date Noted  . CELLULITIS 11/01/2009  . CONTUSION MULTIPLE SITES SHOULDER&UPPER ARM 03/24/2009  . DYSPHAGIA, UNSPECIFIED 07/23/2007    History reviewed. No pertinent surgical history.     Home Medications    Prior to Admission medications   Not on File    Family History Family History  Problem Relation Age of Onset  . Healthy Mother   . Healthy Father     Social History Social History   Tobacco Use  . Smoking status: Former Smoker    Types: Cigarettes  .  Smokeless tobacco: Never Used  Substance Use Topics  . Alcohol use: Yes    Comment: 4-6 per week  . Drug use: No     Allergies   Patient has no known allergies.   Review of Systems Review of Systems  Constitutional: Positive for chills and fatigue. Negative for fever.  HENT: Positive for congestion, rhinorrhea and sore throat. Negative for ear pain, sinus pain and sneezing.   Eyes: Negative for pain and visual disturbance.  Respiratory: Positive for cough. Negative for shortness of breath and wheezing.   Cardiovascular: Negative for chest pain and palpitations.  Gastrointestinal: Negative for abdominal pain and vomiting.  Genitourinary: Negative for dysuria and hematuria.  Musculoskeletal: Positive for myalgias. Negative for arthralgias, back pain and neck pain.  Skin: Negative for color change and rash.  Neurological: Positive for headaches. Negative for seizures and syncope.  All other systems reviewed and are negative.    Physical Exam Triage Vital Signs ED Triage Vitals [01/31/20 1032]  Enc Vitals Group     BP 112/64     Pulse Rate 63     Resp      Temp 98.8 F (37.1 C)     Temp Source Oral     SpO2 98 %     Weight      Height      Head Circumference      Peak Flow      Pain Score  Pain Loc      Pain Edu?      Excl. in GC?    No data found.  Updated Vital Signs BP 112/64 (BP Location: Right Arm)   Pulse 63   Temp 98.8 F (37.1 C) (Oral)   Ht 6\' 2"  (1.88 m)   Wt 163 lb (73.9 kg)   SpO2 98%   BMI 20.93 kg/m   Visual Acuity Right Eye Distance:   Left Eye Distance:   Bilateral Distance:    Right Eye Near:   Left Eye Near:    Bilateral Near:     Physical Exam Vitals and nursing note reviewed.  Constitutional:      Appearance: He is well-developed.  HENT:     Head: Normocephalic and atraumatic.     Right Ear: Hearing, tympanic membrane, ear canal and external ear normal.     Left Ear: Hearing, tympanic membrane, ear canal and external ear  normal.     Nose: Nose normal.     Mouth/Throat:     Pharynx: Uvula midline. No oropharyngeal exudate or posterior oropharyngeal erythema.     Tonsils: No tonsillar abscesses.  Eyes:     Conjunctiva/sclera: Conjunctivae normal.  Cardiovascular:     Rate and Rhythm: Normal rate and regular rhythm.     Heart sounds: No murmur.  Pulmonary:     Effort: Pulmonary effort is normal. No respiratory distress.     Breath sounds: Normal breath sounds. No stridor. No decreased breath sounds, wheezing, rhonchi or rales.  Abdominal:     Palpations: Abdomen is soft.     Tenderness: There is no abdominal tenderness.  Musculoskeletal:     Cervical back: Neck supple.  Skin:    General: Skin is warm and dry.  Neurological:     Mental Status: He is alert.      UC Treatments / Results  Labs (all labs ordered are listed, but only abnormal results are displayed) Labs Reviewed  NOVEL CORONAVIRUS, NAA    EKG   Radiology No results found.  Procedures Procedures (including critical care time)  Medications Ordered in UC Medications - No data to display  Initial Impression / Assessment and Plan / UC Course  I have reviewed the triage vital signs and the nursing notes.  Pertinent labs & imaging results that were available during my care of the patient were reviewed by me and considered in my medical decision making (see chart for details).     Patient here today for Covid testing to return to work.  Does have some viral URI-like symptoms.  Is well-appearing with normal vital signs.  Recommended over-the-counter treatment.  He will quarantine until receiving Covid results. Final Clinical Impressions(s) / UC Diagnoses   Final diagnoses:  Generalized body aches  Viral URI with cough   Discharge Instructions   None    ED Prescriptions    None     PDMP not reviewed this encounter.   , Alecia Lemming 01/31/20 1044

## 2020-01-31 NOTE — ED Triage Notes (Signed)
Woke up with headache, itchy throat, runny nose, back and neck pain

## 2020-02-01 LAB — NOVEL CORONAVIRUS, NAA: SARS-CoV-2, NAA: NOT DETECTED

## 2021-03-18 ENCOUNTER — Emergency Department (INDEPENDENT_AMBULATORY_CARE_PROVIDER_SITE_OTHER)
Admission: EM | Admit: 2021-03-18 | Discharge: 2021-03-18 | Disposition: A | Discharge status: Home / Self Care | Payer: PRIVATE HEALTH INSURANCE | Source: Home / Self Care | Attending: Family Medicine | Admitting: Family Medicine

## 2021-03-18 ENCOUNTER — Other Ambulatory Visit: Payer: Self-pay

## 2021-03-18 DIAGNOSIS — J039 Acute tonsillitis, unspecified: Secondary | ICD-10-CM | POA: Diagnosis not present

## 2021-03-18 LAB — POCT RAPID STREP A (OFFICE): Rapid Strep A Screen: NEGATIVE

## 2021-03-18 MED ORDER — AMOXICILLIN 875 MG PO TABS: 875.0000 mg | ORAL_TABLET | Freq: Two times a day (BID) | ORAL | 0 refills | Status: AC

## 2021-03-18 NOTE — Discharge Instructions (Signed)
Get plenty of rest.  Drink fluids.  Take Tylenol or ibuprofen for pain or fever Take antibiotic 2 times a day.  May stop antibiotic early if throat culture is negative  Expect improvement over next couple of days

## 2021-03-18 NOTE — ED Provider Notes (Signed)
[facility]    CSN: [id number] Arrival date & time: 03/18/21  1512      History   Chief Complaint Chief Complaint  Patient presents with  . Sore Throat    HPI Albert Cameron is a 24 y.o. male.   HPI   Patient has had a sore throat for 3 days.  Progressively getting worse.  Also fever and headache.  Appetite is normal.  No nausea or vomiting.  No cold symptoms runny nose or cough.  No ear pressure pain.  He does not think he has been exposed to strep or Covid.  No body aches or fatigue.  History reviewed. No pertinent past medical history.  Patient Active Problem List   Diagnosis Date Noted  . CELLULITIS 11/01/2009  . CONTUSION MULTIPLE SITES SHOULDER&UPPER ARM 03/24/2009  . DYSPHAGIA, UNSPECIFIED 07/23/2007    History reviewed. No pertinent surgical history.     Home Medications    Prior to Admission medications   Medication Sig Start Date End Date Taking? Authorizing Provider  amoxicillin (AMOXIL) 875 MG tablet Take 1 tablet (875 mg total) by mouth 2 (two) times daily for 10 days. 03/18/21 03/28/21 Yes Eustace Moore, MD    Family History Family History  Problem Relation Age of Onset  . Healthy Mother   . Healthy Father     Social History Social History   Tobacco Use  . Smoking status: Former Smoker    Types: Cigarettes  . Smokeless tobacco: Never Used  Vaping Use  . Vaping Use: Former  . Quit date: 10/20/2018  Substance Use Topics  . Alcohol use: Yes    Comment: 4-6 per week  . Drug use: No     Allergies   Patient has no known allergies.   Review of Systems Review of Systems See HPI  Physical Exam Triage Vital Signs ED Triage Vitals  Enc Vitals Group     BP 03/18/21 1528 109/73     Pulse Rate 03/18/21 1528 76     Resp 03/18/21 1528 16     Temp 03/18/21 1528 98.5 F (36.9 C)     Temp Source 03/18/21 1528 Oral     SpO2 03/18/21 1528 97 %     Weight 03/18/21 1527 162 lb (73.5 kg)     Height 03/18/21 1527 6\' 3"  (1.905  m)     Head Circumference --      Peak Flow --      Pain Score 03/18/21 1526 8     Pain Loc --      Pain Edu? --      Excl. in GC? --    No data found.  Updated Vital Signs BP 109/73 (BP Location: Left Arm)   Pulse 76   Temp 98.5 F (36.9 C) (Oral)   Resp 16   Ht 6\' 3"  (1.905 m)   Wt 73.5 kg   SpO2 97%   BMI 20.25 kg/m      Physical Exam Constitutional:      General: He is not in acute distress.    Appearance: He is well-developed.  HENT:     Head: Normocephalic and atraumatic.     Right Ear: Tympanic membrane and ear canal normal.     Left Ear: Tympanic membrane and ear canal normal.     Nose: Nose normal. No congestion.     Mouth/Throat:     Mouth: Mucous membranes are moist.     Pharynx: Posterior oropharyngeal erythema present.  Eyes:     Conjunctiva/sclera: Conjunctivae normal.     Pupils: Pupils are equal, round, and reactive to light.  Cardiovascular:     Rate and Rhythm: Normal rate.     Heart sounds: Normal heart sounds.  Pulmonary:     Effort: Pulmonary effort is normal. No respiratory distress.     Breath sounds: Normal breath sounds.  Abdominal:     General: There is no distension.     Palpations: Abdomen is soft.  Musculoskeletal:        General: Normal range of motion.     Cervical back: Normal range of motion.  Lymphadenopathy:     Cervical: Cervical adenopathy present.  Skin:    General: Skin is warm and dry.  Neurological:     Mental Status: He is alert.  Psychiatric:        Behavior: Behavior normal.      UC Treatments / Results  Labs (all labs ordered are listed, but only abnormal results are displayed) Labs Reviewed  CULTURE, GROUP A STREP  POCT RAPID STREP A (OFFICE)  negative   EKG   Radiology No results found.  Procedures Procedures (including critical care time)  Medications Ordered in UC Medications - No data to display  Initial Impression / Assessment and Plan / UC Course  I have reviewed the triage vital  signs and the nursing notes.  Pertinent labs & imaging results that were available during my care of the patient were reviewed by me and considered in my medical decision making (see chart for details).     Strep test is negative.  Patient has large red tonsils, with exudate and adenopathy.  Will cover with with antibiotics until the throat culture is back.  He is advised to stop antibiotics early if the culture is negative. Final Clinical Impressions(s) / UC Diagnoses   Final diagnoses:  Acute tonsillitis, unspecified etiology     Discharge Instructions     Get plenty of rest.  Drink fluids.  Take Tylenol or ibuprofen for pain or fever Take antibiotic 2 times a day.  May stop antibiotic early if throat culture is negative  Expect improvement over next couple of days     ED Prescriptions    Medication Sig Dispense Auth. Provider   amoxicillin (AMOXIL) 875 MG tablet Take 1 tablet (875 mg total) by mouth 2 (two) times daily for 10 days. 20 tablet Eustace Moore, MD     PDMP not reviewed this encounter.   Eustace Moore, MD 03/18/21 [phone]

## 2021-03-18 NOTE — ED Triage Notes (Signed)
Pt presents with sore throat, fever and white patches on back of throat since Friday night. Pt has taken tylenol which did help with the pain a little. Pt has not had flu vaccine or covid.

## 2021-03-21 LAB — CULTURE, GROUP A STREP

## 2023-11-24 ENCOUNTER — Other Ambulatory Visit: Payer: Self-pay

## 2023-11-24 ENCOUNTER — Ambulatory Visit
Admission: EM | Admit: 2023-11-24 | Discharge: 2023-11-24 | Disposition: A | Discharge status: Home / Self Care | Payer: BC Managed Care – PPO | Attending: Family Medicine | Admitting: Family Medicine

## 2023-11-24 DIAGNOSIS — R112 Nausea with vomiting, unspecified: Secondary | ICD-10-CM | POA: Diagnosis not present

## 2023-11-24 DIAGNOSIS — R197 Diarrhea, unspecified: Secondary | ICD-10-CM

## 2023-11-24 DIAGNOSIS — J101 Influenza due to other identified influenza virus with other respiratory manifestations: Secondary | ICD-10-CM | POA: Diagnosis not present

## 2023-11-24 LAB — POCT INFLUENZA A/B
Influenza A, POC: NEGATIVE
Influenza B, POC: POSITIVE — AB

## 2023-11-24 LAB — POC SARS CORONAVIRUS 2 AG -  ED: SARS Coronavirus 2 Ag: NEGATIVE

## 2023-11-24 MED ORDER — OSELTAMIVIR PHOSPHATE 75 MG PO CAPS: 75.0000 mg | ORAL_CAPSULE | Freq: Two times a day (BID) | ORAL | 0 refills | Status: AC

## 2023-11-24 MED ORDER — ONDANSETRON 8 MG PO TBDP: 8.0000 mg | ORAL_TABLET | Freq: Three times a day (TID) | ORAL | 0 refills | Status: AC | PRN

## 2023-11-24 NOTE — ED Triage Notes (Signed)
Pt c/o n/v/d that began Sunday afternoon. Reports the vomiting has lessened, but that the diarrhea continues. Also reports body aches, congestion, and HA.

## 2023-11-24 NOTE — Discharge Instructions (Addendum)
Advised patient to take medication as directed with food to completion.  Advised may take Zofran daily or as needed for nausea.  Encouraged to increase daily water intake to 64 ounces per day while taking these medications.  Advised may take OTC Tylenol 1 g every 6 hours for oral temperature greater than 100.3.  Advised if symptoms worsen and/or unresolved please follow-up PCP or here for further evaluation.

## 2023-11-24 NOTE — ED Provider Notes (Signed)
[facility]    CSN: [id number] Arrival date & time: 11/24/23  4098      History   Chief Complaint Chief Complaint  Patient presents with   Emesis   Diarrhea    HPI Albert Cameron is a 27 y.o. male.   HPI 27 year old male presents with nausea, vomiting, diarrhea x 2 days.  Reports vomiting has lessened however diarrhea continues.  History reviewed. No pertinent past medical history.  Patient Active Problem List   Diagnosis Date Noted   CELLULITIS 11/01/2009   CONTUSION MULTIPLE SITES SHOULDER&UPPER ARM 03/24/2009   Dysphagia 07/23/2007    History reviewed. No pertinent surgical history.     Home Medications    Prior to Admission medications   Medication Sig Start Date End Date Taking? Authorizing Provider  ondansetron (ZOFRAN-ODT) 8 MG disintegrating tablet Take 1 tablet (8 mg total) by mouth every 8 (eight) hours as needed for nausea or vomiting. 11/24/23  Yes Trevor Iha, FNP  oseltamivir (TAMIFLU) 75 MG capsule Take 1 capsule (75 mg total) by mouth every 12 (twelve) hours. 11/24/23  Yes Trevor Iha, FNP    Family History Family History  Problem Relation Age of Onset   Healthy Mother    Healthy Father     Social History Social History   Tobacco Use   Smoking status: Former    Types: Cigarettes   Smokeless tobacco: Never  Vaping Use   Vaping status: Former   Quit date: 10/20/2018  Substance Use Topics   Alcohol use: Yes    Comment: 4-6 per week   Drug use: No     Allergies   Patient has no known allergies.   Review of Systems Review of Systems  Gastrointestinal:  Positive for diarrhea, nausea and vomiting.     Physical Exam Triage Vital Signs ED Triage Vitals  Encounter Vitals Group     BP 11/24/23 0850 (!) 146/90     Systolic BP Percentile --      Diastolic BP Percentile --      Pulse Rate 11/24/23 0850 90     Resp 11/24/23 0850 18     Temp 11/24/23 0850 98.2 F (36.8 C)     Temp Source 11/24/23 0850 Oral      SpO2 11/24/23 0850 100 %     Weight --      Height --      Head Circumference --      Peak Flow --      Pain Score 11/24/23 0849 7     Pain Loc --      Pain Education --      Exclude from Growth Chart --    No data found.  Updated Vital Signs BP (!) 146/90 (BP Location: Left Arm)   Pulse 90   Temp 98.2 F (36.8 C) (Oral)   Resp 18   SpO2 100%    Physical Exam Vitals and nursing note reviewed.  Constitutional:      Appearance: Normal appearance. He is normal weight. He is ill-appearing.  HENT:     Head: Normocephalic and atraumatic.     Right Ear: Tympanic membrane, ear canal and external ear normal.     Left Ear: Tympanic membrane, ear canal and external ear normal.     Mouth/Throat:     Mouth: Mucous membranes are moist.     Pharynx: Oropharynx is clear.  Eyes:     Extraocular Movements: Extraocular movements intact.     Conjunctiva/sclera: Conjunctivae normal.  Pupils: Pupils are equal, round, and reactive to light.  Cardiovascular:     Rate and Rhythm: Normal rate and regular rhythm.     Pulses: Normal pulses.     Heart sounds: Normal heart sounds. No murmur heard.    No friction rub. No gallop.  Pulmonary:     Effort: Pulmonary effort is normal.     Breath sounds: Normal breath sounds. No wheezing, rhonchi or rales.  Musculoskeletal:        General: Normal range of motion.     Cervical back: Normal range of motion and neck supple.  Skin:    General: Skin is warm and dry.  Neurological:     General: No focal deficit present.     Mental Status: He is alert and oriented to person, place, and time. Mental status is at baseline.  Psychiatric:        Mood and Affect: Mood normal.        Behavior: Behavior normal.      UC Treatments / Results  Labs (all labs ordered are listed, but only abnormal results are displayed) Labs Reviewed  POCT INFLUENZA A/B - Abnormal; Notable for the following components:      Result Value   Influenza B, POC Positive  (*)    All other components within normal limits  POC SARS CORONAVIRUS 2 AG -  ED    EKG   Radiology No results found.  Procedures Procedures (including critical care time)  Medications Ordered in UC Medications - No data to display  Initial Impression / Assessment and Plan / UC Course  I have reviewed the triage vital signs and the nursing notes.  Pertinent labs & imaging results that were available during my care of the patient were reviewed by me and considered in my medical decision making (see chart for details).     MDM: 1. Influenza B-Rx'd Tamiflu 75 mg capsule: Take 1 capsule twice daily x 5 days; 2.  Nausea vomiting and diarrhea-Rx'd Zofran 8 mg disintegrating tablet: Take 1 tablet every 8 hours, as needed for nausea. Advised patient to take medication as directed with food to completion.  Advised may take Zofran daily or as needed for nausea.  Encouraged to increase daily water intake to 64 ounces per day while taking these medications.  Advised may take OTC Tylenol 1 g every 6 hours for oral temperature greater than 100.3.  Advised if symptoms worsen and/or unresolved please follow-up PCP or here for further evaluation.  Work note provided to patient prior to discharge today.  Patient discharged home, hemodynamically stable. Final Clinical Impressions(s) / UC Diagnoses   Final diagnoses:  Nausea vomiting and diarrhea  Influenza B     Discharge Instructions      Advised patient to take medication as directed with food to completion.  Advised may take Zofran daily or as needed for nausea.  Encouraged to increase daily water intake to 64 ounces per day while taking these medications.  Advised may take OTC Tylenol 1 g every 6 hours for oral temperature greater than 100.3.  Advised if symptoms worsen and/or unresolved please follow-up PCP or here for further evaluation.     ED Prescriptions     Medication Sig Dispense Auth. Provider   oseltamivir (TAMIFLU) 75 MG  capsule Take 1 capsule (75 mg total) by mouth every 12 (twelve) hours. 10 capsule Trevor Iha, FNP   ondansetron (ZOFRAN-ODT) 8 MG disintegrating tablet Take 1 tablet (8 mg total) by mouth every  8 (eight) hours as needed for nausea or vomiting. 24 tablet Trevor Iha, FNP      PDMP not reviewed this encounter.   Trevor Iha, FNP 11/24/23 1005
